# Patient Record
Sex: Male | Born: 1970 | Race: Black or African American | Hispanic: No | Marital: Married
Health system: Southern US, Community
[De-identification: ages and names within clinical notes are randomized; demographics above are authoritative.]

---

## 2005-06-20 ENCOUNTER — Emergency Department (HOSPITAL_COMMUNITY): Admission: EM | Admit: 2005-06-20 | Discharge: 2005-06-20 | Payer: Self-pay | Admitting: Emergency Medicine

## 2005-06-21 ENCOUNTER — Emergency Department (HOSPITAL_COMMUNITY): Admission: EM | Admit: 2005-06-21 | Discharge: 2005-06-21 | Payer: Self-pay | Admitting: Family Medicine

## 2008-09-15 ENCOUNTER — Emergency Department (HOSPITAL_BASED_OUTPATIENT_CLINIC_OR_DEPARTMENT_OTHER): Admission: EM | Admit: 2008-09-15 | Discharge: 2008-09-15 | Payer: Self-pay | Admitting: Emergency Medicine

## 2009-12-13 ENCOUNTER — Emergency Department (HOSPITAL_COMMUNITY): Admission: EM | Admit: 2009-12-13 | Discharge: 2009-12-13 | Payer: Self-pay | Admitting: Family Medicine

## 2010-07-12 IMAGING — CR DG CHEST 2V
2 series · 2 of 2 positions shown · non-contrast
Comparison: None available.

CLINICAL DATA: Right side chest pain.

CHEST - 2 VIEW

[view not recorded (1 of 2)]
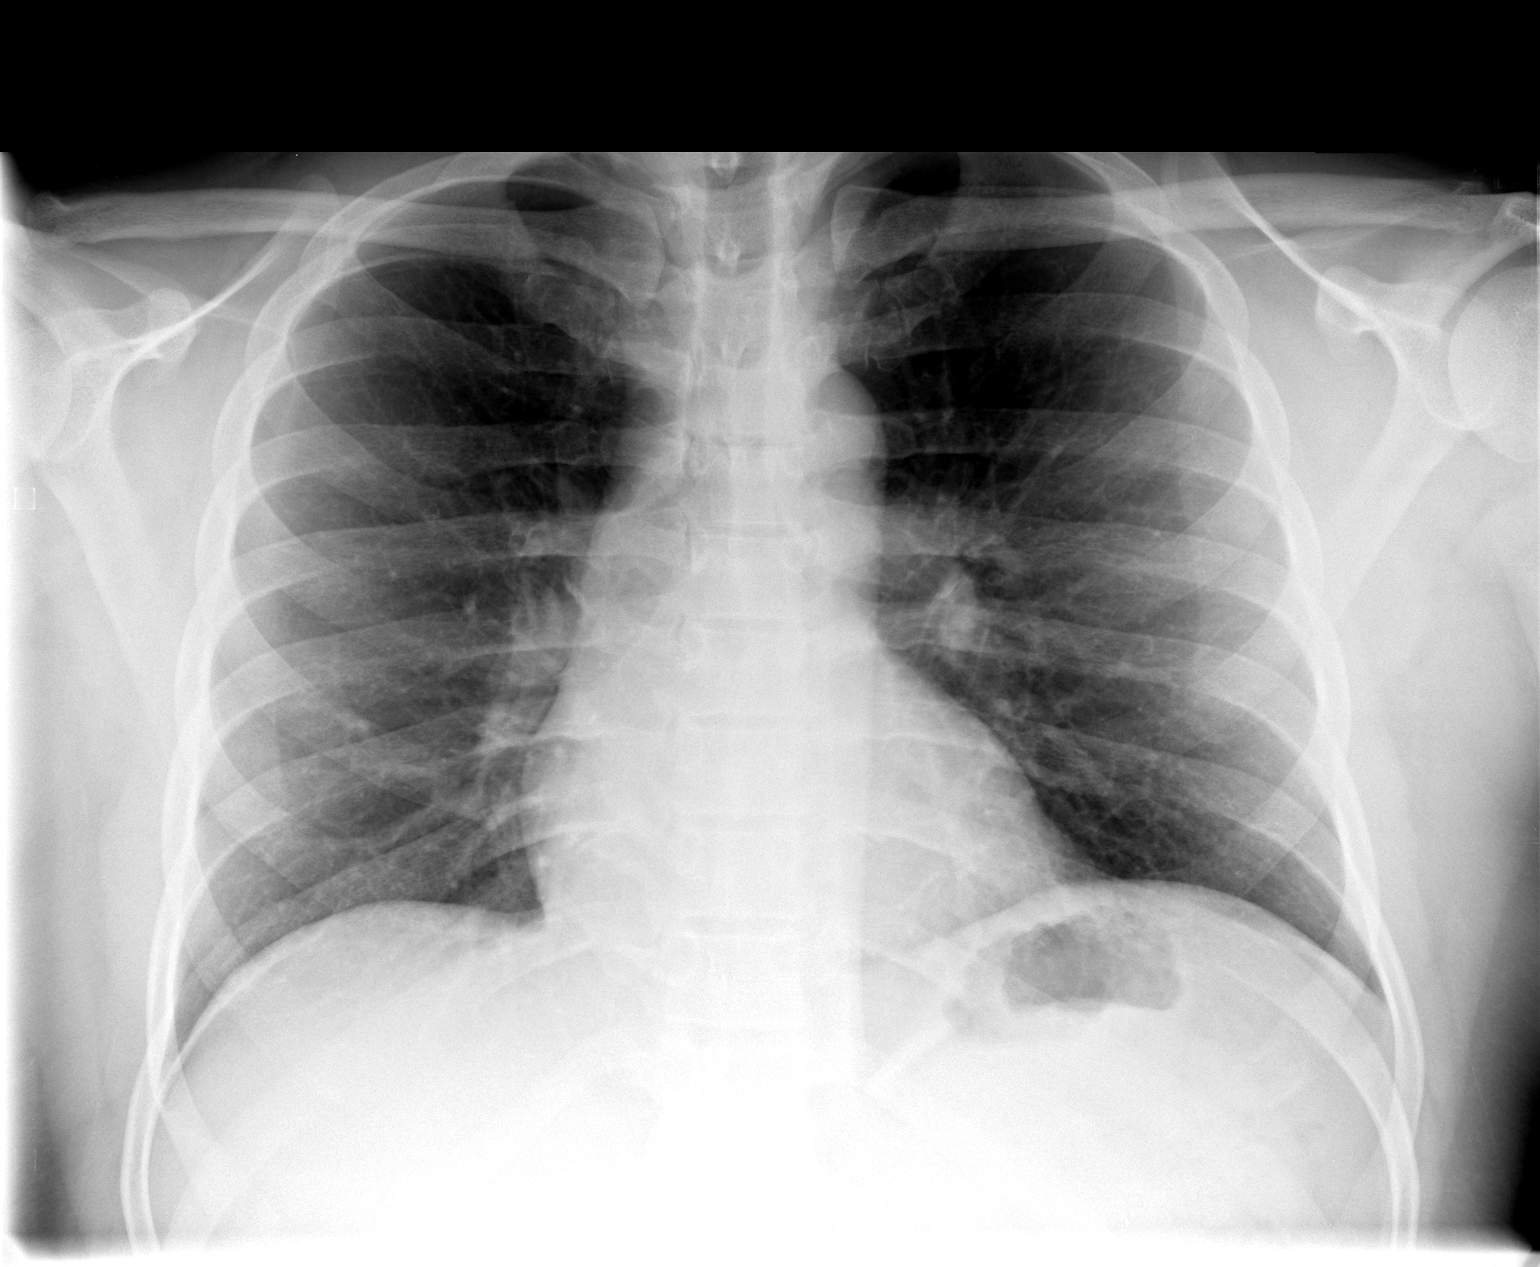

[view not recorded (2 of 2)]
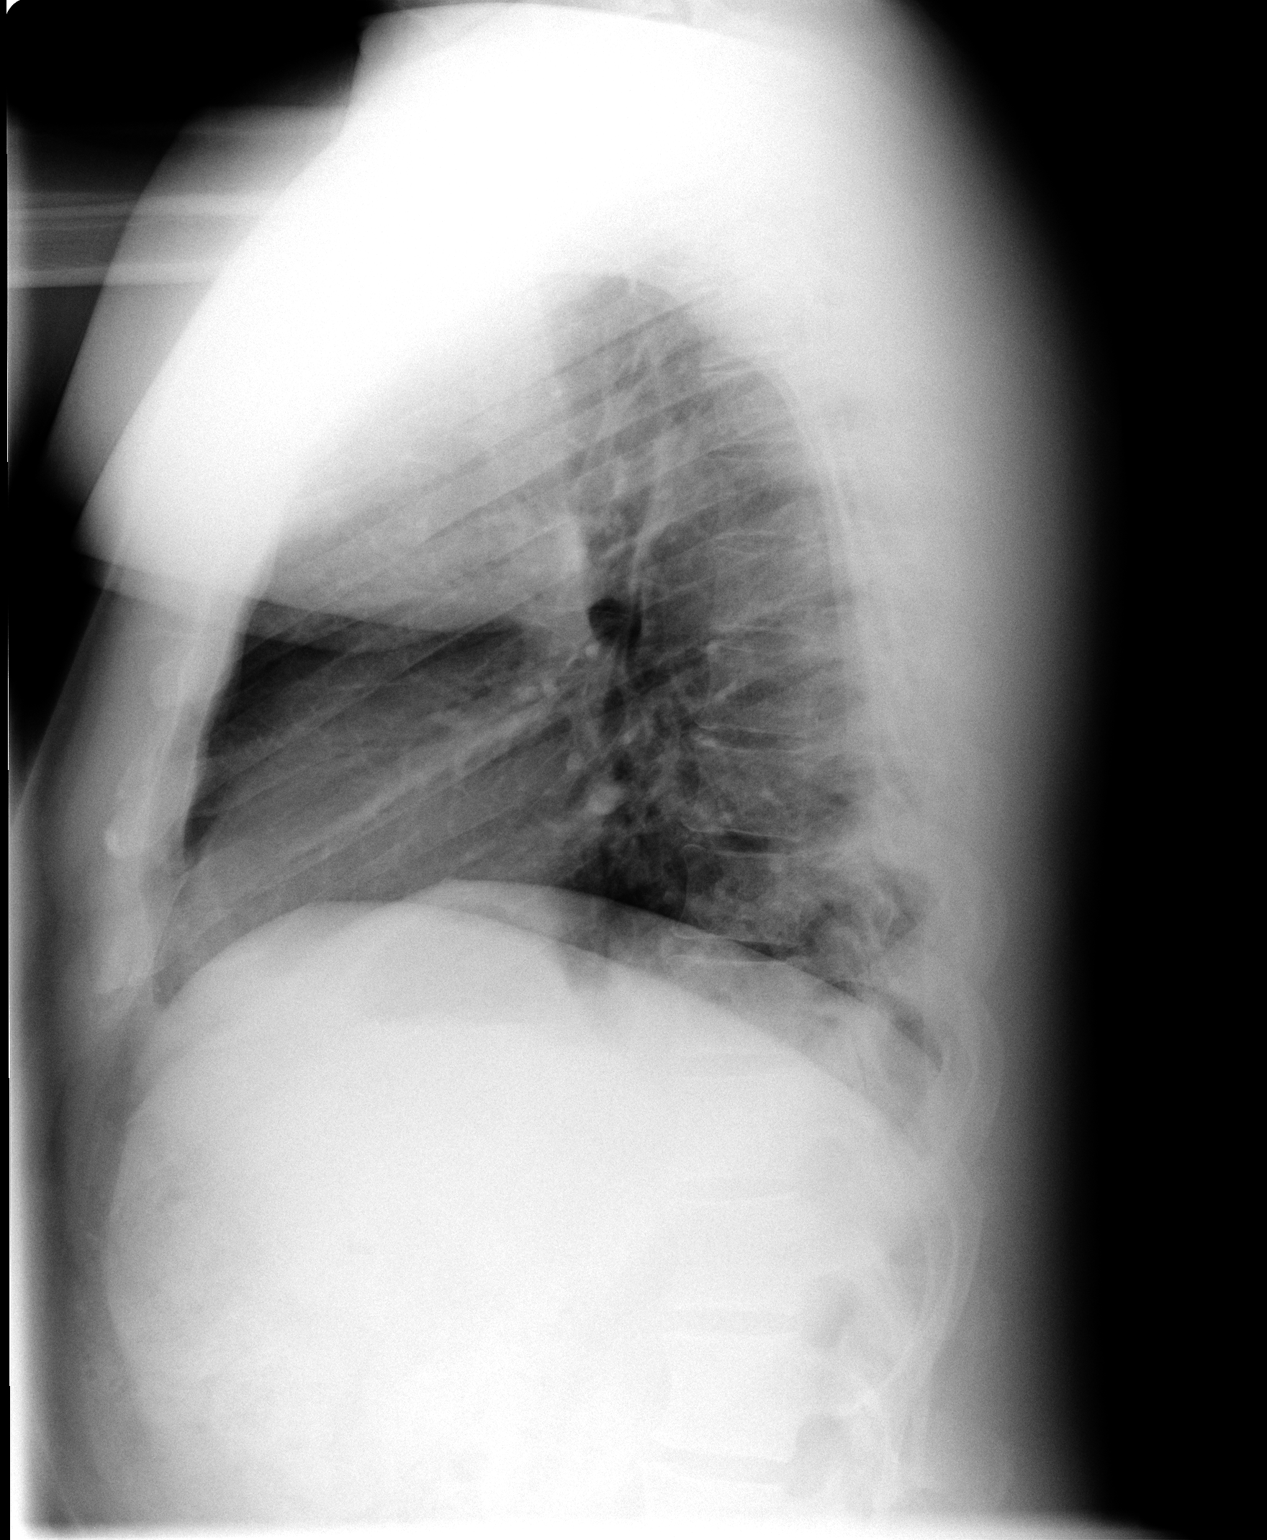

[2 of 2 positions shown; findings below may reference images not displayed]

FINDINGS: Lungs clear.  Heart size normal.  No pleural effusion or
focal bony abnormality.
IMPRESSION: Negative chest.

## 2010-12-05 ENCOUNTER — Ambulatory Visit
Admission: RE | Admit: 2010-12-05 | Discharge: 2010-12-05 | Payer: Self-pay | Source: Home / Self Care | Admitting: Family Medicine

## 2010-12-05 ENCOUNTER — Encounter: Payer: Self-pay | Admitting: Family Medicine

## 2010-12-05 DIAGNOSIS — S058X9A Other injuries of unspecified eye and orbit, initial encounter: Secondary | ICD-10-CM

## 2010-12-05 DIAGNOSIS — J309 Allergic rhinitis, unspecified: Secondary | ICD-10-CM | POA: Insufficient documentation

## 2010-12-09 ENCOUNTER — Telehealth (INDEPENDENT_AMBULATORY_CARE_PROVIDER_SITE_OTHER): Payer: Self-pay | Admitting: *Deleted

## 2011-01-07 NOTE — Letter (Signed)
Summary: Out of Work  [facility]  [address]   [place], [place] [address]   Phone: [phone]  Fax: [phone]    December 05, 2010   Employee:  Patrick Fleming    To Whom It May Concern:   For Medical reasons, please excuse the above named employee from work today through 12/07/10.  If you need additional information, please feel free to contact our office.         Sincerely,    Donna Christen MD

## 2011-01-07 NOTE — Progress Notes (Signed)
  Phone Note Outgoing Call Call back at [facility] Phone [phone]   Call placed by: Lajean Saver RN,  December 09, 2010 11:24 AM Call placed to: Patient Action Taken: Phone Call Completed Summary of Call: Callback: Patient reports his eye is much better. He went and saw his opthamologist who told him to stop the ointment.

## 2011-01-07 NOTE — Assessment & Plan Note (Signed)
Summary: SCRATCHED CORNEA/TM room 3   Vital Signs:  Patient Profile:   40 Years Old Male CC:      RT eye injury Height:     69.5 inches Weight:      236.50 pounds O2 Sat:      98 % O2 treatment:    Room Air Temp:     99.1 degrees F oral Pulse rate:   72 / minute Resp:     18 per minute BP sitting:   142 / 92  (left arm) Cuff size:   regular  Vitals Entered By: Clemens Catholic LPN (December 05, 2010 1:54 PM)              Vision Screening: Left eye w/o correction: 20 / 20 Right Eye w/o correction: 20 / 30 Both eyes w/o correction:  20/ 30        Vision Entered By: Clemens Catholic LPN (December 05, 2010 2:09 PM)    Updated Prior Medication List: CENTRUM  TABS (MULTIPLE VITAMINS-MINERALS)  ZYRTEC HIVES RELIEF 10 MG TABS (CETIRIZINE HCL)  ZANTAC 150 MG TABS (RANITIDINE HCL)  vitamin c Current Allergies: ! * CONTRAST DYE ! * SHELLFISH ! * TREE NUTHistory of Present Illness Chief Complaint: RT eye injury History of Present Illness:  Subjective:  Patient reports that his infant child accidentally scraped his right eye with finger last night.  He has had persistent pain and foreign body sensation.  REVIEW OF SYSTEMS Constitutional Symptoms      Denies fever, chills, night sweats, weight loss, weight gain, and fatigue.  Eyes       Complains of change in vision and eye pain.      Denies eye discharge, glasses, contact lenses, and eye surgery. Ear/Nose/Throat/Mouth       Denies hearing loss/aids, change in hearing, ear pain, ear discharge, dizziness, frequent runny nose, frequent nose bleeds, sinus problems, sore throat, hoarseness, and tooth pain or bleeding.  Respiratory       Denies dry cough, productive cough, wheezing, shortness of breath, asthma, bronchitis, and emphysema/COPD.  Cardiovascular       Denies murmurs, chest pain, and tires easily with exhertion.    Gastrointestinal       Denies stomach pain, nausea/vomiting, diarrhea, constipation, blood in bowel  movements, and indigestion. Genitourniary       Denies painful urination, kidney stones, and loss of urinary control. Neurological       Denies paralysis, seizures, and fainting/blackouts. Musculoskeletal       Denies muscle pain, joint pain, joint stiffness, decreased range of motion, redness, swelling, muscle weakness, and gout.  Skin       Denies bruising, unusual mles/lumps or sores, and hair/skin or nail changes.  Psych       Denies mood changes, temper/anger issues, anxiety/stress, speech problems, depression, and sleep problems. Other Comments: pt c/o Rt eye injury last PM he was playing with his daughter and she scratched his eye with her finger nail.   Past History:  Past Medical History: Allergic rhinitis "cold hives"  Past Surgical History: Tonsillectomy wrist sx  Family History: none  Social History: Never Smoked Alcohol use-yes socially Drug use-no Smoking Status:  never Drug Use:  no   Objective:  No acute distress, Wearing right eye patch Eyes:  Pupils are equal, round, and reactive to light and accomdation.  Extraocular movement is intact.   Right conjunctivae injected.  Difficult to fully visualize right fundi.   Right photophobia present.  No lid lacerations.  After instilling tetracaine ophth. suspension into the right eye, right lid eversion reveals no injury beneath lid.  Fluorescein to the right eye reveals a superficial conjunctival abrasion centrally just beneath the center of vision. Assessment New Problems: CORNEAL ABRASION, RIGHT (ICD-918.1) ALLERGIC RHINITIS (ICD-477.9)   Plan New Medications/Changes: LORTAB 5 5-500 MG TABS (HYDROCODONE-ACETAMINOPHEN) One or two tabs by mouth q4 to 6hr as needed pain  #15 (fifteen) x 0, 12/05/2010, Donna Christen MD TOBREX 0.3 % OINT (TOBRAMYCIN SULFATE) Apply 0.5inch ribbon to affected eye three times a day  #3.5gm x 0, 12/05/2010, Donna Christen MD  New Orders: New Patient Level III [phone] Planning  Comments:   Begin Tobrex ophth ointment three times a day.  May continue eye patch.  Rx for analgesic.  Follow-up with ophthalmologist in 48 hours.  Return for worsening symptoms   The patient and/or caregiver has been counseled thoroughly with regard to medications prescribed including dosage, schedule, interactions, rationale for use, and possible side effects and they verbalize understanding.  Diagnoses and expected course of recovery discussed and will return if not improved as expected or if the condition worsens. Patient and/or caregiver verbalized understanding.  Prescriptions: LORTAB 5 5-500 MG TABS (HYDROCODONE-ACETAMINOPHEN) One or two tabs by mouth q4 to 6hr as needed pain  #15 (fifteen) x 0   Entered and Authorized by:   Donna Christen MD   Signed by:   Donna Christen MD on 12/05/2010   Method used:   Print then Give to Patient   RxID:   [id number] TOBREX 0.3 % OINT (TOBRAMYCIN SULFATE) Apply 0.5inch ribbon to affected eye three times a day  #3.5gm x 0   Entered and Authorized by:   Donna Christen MD   Signed by:   Donna Christen MD on 12/05/2010   Method used:   Print then Give to Patient   RxID:   [id number]   Orders Added: 1)  New Patient Level III [[id number]]

## 2011-09-07 LAB — WOUND CULTURE

## 2020-06-28 ENCOUNTER — Ambulatory Visit: Payer: Self-pay | Attending: Internal Medicine

## 2020-06-28 DIAGNOSIS — Z23 Encounter for immunization: Secondary | ICD-10-CM

## 2020-06-28 NOTE — Progress Notes (Signed)
   Covid-19 Vaccination Clinic  Name:  Patrick Fleming    MRN: [id number] DOB: [birth year]  06/28/2020  [name] was observed post Covid-19 immunization for 15 minutes without incident. He was provided with Vaccine Information Sheet and instruction to access the V-Safe system.   [name] was instructed to call 911 with any severe reactions post vaccine: Marland Kitchen Difficulty breathing  . Swelling of face and throat  . A fast heartbeat  . A bad rash all over body  . Dizziness and weakness   Immunizations Administered    Name Date Dose VIS Date Route   Pfizer COVID-19 Vaccine 06/28/2020 10:34 AM 0.3 mL 01/30/2019 Intramuscular   Manufacturer: [employer], [employer]   Lot: [id number]   NDC: [address]-6
